# Patient Record
Sex: Male | Born: 1995 | Race: Black or African American | Hispanic: No | Marital: Single | Smoking: Never smoker
Health system: Southern US, Community
[De-identification: ages and names within clinical notes are randomized; demographics above are authoritative.]

## PROBLEM LIST (undated history)

## (undated) DIAGNOSIS — J45909 Unspecified asthma, uncomplicated: Secondary | ICD-10-CM

---
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
## 2016-02-21 ENCOUNTER — Ambulatory Visit (HOSPITAL_COMMUNITY)
Admission: EM | Admit: 2016-02-21 | Discharge: 2016-02-21 | Disposition: A | Discharge status: Home / Self Care | Payer: Medicaid - Out of State | Attending: Family Medicine | Admitting: Family Medicine

## 2016-02-21 ENCOUNTER — Encounter (HOSPITAL_COMMUNITY): Payer: Self-pay | Admitting: Emergency Medicine

## 2016-02-21 DIAGNOSIS — S0083XA Contusion of other part of head, initial encounter: Secondary | ICD-10-CM

## 2016-02-21 NOTE — ED Provider Notes (Signed)
[facility]    CSN: [id number] Arrival date & time: 02/21/16  1052  First Provider Contact:  First MD Initiated Contact with Patient 02/21/16 1212     History   Chief Complaint No chief complaint on file.   HPI Douglas Schmidt is a 20 y.o. male.   HPI He reports a head-to-head collision with his cousin while on a trampoline 2 nights ago. He struck his left upper forehead and noted immediate pain which rapidly improved to where he experienced no pain. He is worried that the area appears indented now. He denies any wound, swelling, tenderness, HA, vision changes, N/V, imbalance, concentration difficulties. No therapies tried.   Past Medical History:  Diagnosis Date  . Asthma, currently inactive    There are no active problems to display for this patient.  No past surgical history on file.  Home Medications    Prior to Admission medications   Medication Sig Start Date End Date Taking? Authorizing Provider  Cetirizine HCl 10 MG CAPS Take 1 capsule (10 mg total) by mouth daily. 01/21/14   Niel Hummer, MD   Family History No family history on file.  Social History Social History  Substance Use Topics  . Smoking status: Never Smoker  . Smokeless tobacco: Not on file  . Alcohol use Not on file   Allergies   Review of patient's allergies indicates no known allergies.  Review of Systems Review of Systems As above.   Physical Exam Triage Vital Signs ED Triage Vitals [02/21/16 1216]  Enc Vitals Group     BP 121/72     Pulse Rate 75     Resp 16     Temp 98.1 F (36.7 C)     Temp Source Oral     SpO2 99 %     Weight      Height      Head Circumference      Peak Flow      Pain Score      Pain Loc      Pain Edu?      Excl. in GC?    No data found.   Updated Vital Signs BP 121/72 (BP Location: Left Arm)   Pulse 75   Temp 98.1 F (36.7 C) (Oral)   Resp 16   SpO2 99%   Physical Exam  Constitutional: He is oriented to person, place, and time. He  appears well-developed and well-nourished. No distress.  HENT:  Nose: Nose normal.  Mouth/Throat: Oropharynx is clear and moist.  normocephalic with very slight protuberance on left forehead without any tenderness or palpable deformity.   Eyes: Conjunctivae and EOM are normal. Pupils are equal, round, and reactive to light. No scleral icterus.  Neck: Normal range of motion. Neck supple.  Cardiovascular: Normal rate, regular rhythm, normal heart sounds and intact distal pulses.   No murmur heard. Pulmonary/Chest: Effort normal and breath sounds normal. No respiratory distress.  Abdominal: Soft. Bowel sounds are normal. He exhibits no distension. There is no tenderness.  Musculoskeletal: Normal range of motion. He exhibits no edema, tenderness or deformity.  Lymphadenopathy:    He has no cervical adenopathy.  Neurological: He is alert and oriented to person, place, and time. No cranial nerve deficit. He exhibits normal muscle tone. Coordination normal.  balance normal with single leg stand  Skin: Skin is warm and dry.  Psychiatric: He has a normal mood and affect. His behavior is normal.  Vitals reviewed.  UC Treatments / Results  Labs (all labs ordered are listed, but only abnormal results are displayed) Labs Reviewed - No data to display  EKG  EKG Interpretation None       Radiology No results found.  Procedures Procedures (including critical care time)  Medications Ordered in UC Medications - No data to display  Initial Impression / Assessment and Plan / UC Course  I have reviewed the triage vital signs and the nursing notes.  Pertinent labs & imaging results that were available during my care of the patient were reviewed by me and considered in my medical decision making (see chart for details).  Final Clinical Impressions(s) / UC Diagnoses   Final diagnoses:  Forehead contusion, initial encounter   No evidence of hematoma. No sequelae consistent with concussion  or fracture. Advised symptomatic management with topical ice and NSAIDs prn.   New Prescriptions Current Discharge Medication List       Tyrone Nine, MD 02/21/16 1243

## 2016-02-21 NOTE — Discharge Instructions (Signed)
You should see improvement in the area gradually over the next few days. Apply cold compresses to the area to help.   If symptoms aren't improved a week after the incident, please seek medical attention.

## 2016-08-02 ENCOUNTER — Encounter (HOSPITAL_COMMUNITY): Payer: Self-pay | Admitting: Family Medicine

## 2016-08-02 ENCOUNTER — Ambulatory Visit (HOSPITAL_COMMUNITY)
Admission: EM | Admit: 2016-08-02 | Discharge: 2016-08-02 | Disposition: A | Discharge status: Home / Self Care | Payer: Self-pay | Attending: Internal Medicine | Admitting: Internal Medicine

## 2016-08-02 DIAGNOSIS — S0090XA Unspecified superficial injury of unspecified part of head, initial encounter: Secondary | ICD-10-CM

## 2016-08-02 DIAGNOSIS — S0990XA Unspecified injury of head, initial encounter: Secondary | ICD-10-CM

## 2016-08-02 NOTE — Discharge Instructions (Signed)
Nice to meet you. I do not feel a dent or appreciate any swelling and do not feel at this time further imaging is warranted. If you are really worried then go to the ED where they can evaluate this further.

## 2016-08-02 NOTE — ED Triage Notes (Signed)
Pt here for head injury. Pt here sts that he has an indention in his for head and he's worried about it.

## 2016-08-02 NOTE — ED Provider Notes (Signed)
CSN: [id number]     Arrival date & time 08/02/16  1751 History   None    Chief Complaint  Patient presents with  . Head Injury   (Consider location/radiation/quality/duration/timing/severity/associated sxs/prior Treatment)  21 yo presents following a "head butt" with a customer at [employer] today. They were both going down to pick up a coin and bumped heads. He is worried about a possible dent on the right side of forehead. He had no LOC. No swelling. No mental changes or dizziness. He has some mild soreness but no pain. No N, V.        Past Medical History:  Diagnosis Date  . Asthma, currently inactive    History reviewed. No pertinent surgical history. History reviewed. No pertinent family history. Social History  Substance Use Topics  . Smoking status: Never Smoker  . Smokeless tobacco: Never Used  . Alcohol use Not on file    Review of Systems  All other systems reviewed and are negative.   Allergies  Patient has no known allergies.  Home Medications   Prior to Admission medications   Medication Sig Start Date End Date Taking? Authorizing Provider  Cetirizine HCl 10 MG CAPS Take 1 capsule (10 mg total) by mouth daily. 01/21/14   Niel Hummeross Kuhner, MD   Meds Ordered and Administered this Visit  Medications - No data to display  BP 136/84   Pulse 62   Temp 97.4 F (36.3 C)   Resp 18   SpO2 96%  No data found.   Physical Exam  Constitutional: He is oriented to person, place, and time. He appears well-developed and well-nourished. No distress.  HENT:  Head: Normocephalic and atraumatic.  His forehead is normal appearing without swelling or signs of trauma. No indention is palpated in the forehead.   Neurological: He is alert and oriented to person, place, and time. No cranial nerve deficit.  Skin: Skin is warm and dry. He is not diaphoretic.  Psychiatric: His behavior is normal.  Nursing note and vitals reviewed.   Urgent Care Course   Clinical Course      Procedures (including critical care time)  Labs Review Labs Reviewed - No data to display  Imaging Review No results found.   Visual Acuity Review  Right Eye Distance:   Left Eye Distance:   Bilateral Distance:    Right Eye Near:   Left Eye Near:    Bilateral Near:         MDM   1. Minor head injury, initial encounter    Reassurance given that I do not appreciate an indention or signs of trauma. I do find a need to urgently send to ED for CT as he is asymptomatic. Certainly if he becomes symptomatic then f/u in the ED for further imaging. Patient is stable for d/C.     Riki SheerMichelle G Young, PA-C 08/02/16 2026

## 2017-02-09 ENCOUNTER — Encounter (HOSPITAL_COMMUNITY): Payer: Self-pay | Admitting: Emergency Medicine

## 2017-02-09 ENCOUNTER — Emergency Department (HOSPITAL_COMMUNITY)
Admission: EM | Admit: 2017-02-09 | Discharge: 2017-02-09 | Disposition: A | Discharge status: Home / Self Care | Payer: Worker's Compensation | Attending: Physician Assistant | Admitting: Physician Assistant

## 2017-02-09 ENCOUNTER — Emergency Department (HOSPITAL_COMMUNITY): Payer: Worker's Compensation

## 2017-02-09 DIAGNOSIS — W208XXA Other cause of strike by thrown, projected or falling object, initial encounter: Secondary | ICD-10-CM | POA: Insufficient documentation

## 2017-02-09 DIAGNOSIS — Y9389 Activity, other specified: Secondary | ICD-10-CM | POA: Diagnosis not present

## 2017-02-09 DIAGNOSIS — Y92512 Supermarket, store or market as the place of occurrence of the external cause: Secondary | ICD-10-CM | POA: Diagnosis not present

## 2017-02-09 DIAGNOSIS — Y990 Civilian activity done for income or pay: Secondary | ICD-10-CM | POA: Diagnosis not present

## 2017-02-09 DIAGNOSIS — J45909 Unspecified asthma, uncomplicated: Secondary | ICD-10-CM | POA: Insufficient documentation

## 2017-02-09 DIAGNOSIS — S0003XA Contusion of scalp, initial encounter: Secondary | ICD-10-CM

## 2017-02-09 DIAGNOSIS — Z79899 Other long term (current) drug therapy: Secondary | ICD-10-CM | POA: Insufficient documentation

## 2017-02-09 DIAGNOSIS — S0990XA Unspecified injury of head, initial encounter: Secondary | ICD-10-CM | POA: Diagnosis present

## 2017-02-09 NOTE — ED Notes (Signed)
Patient taken to XRAY

## 2017-02-09 NOTE — ED Provider Notes (Signed)
[facility]-EMERGENCY DEPT Provider Note   CSN: [id number] Arrival date & time: 02/09/17  1255   By signing my name below, I, Soijett Blue, attest that this documentation has been prepared under the direction and in the presence of Sharen Heck, PA-C Electronically Signed: Soijett Blue, ED Scribe. 02/09/17. 3:31 PM.  History   Chief Complaint Chief Complaint  Patient presents with  . Head Injury  . Headache    HPI Douglas Schmidt is a 21 y.o. male who presents to the Emergency Department complaining of head injury occurring PTA. Pt reports associated 3/10 focal pain to top of his skull. Pt has not tried any medications for the relief of his symptoms. He notes that a metal 20 lb shelf from a cart fell off and struck the top of his head while at work [employer]). Pt states that he saw a "flash" in his eyes that quickly resolved following the incident.  He denies nausea, vomiting, vision change, neck pain, LOC, and any other symptoms. No bleeding.    The history is provided by the patient. No language interpreter was used.    Past Medical History:  Diagnosis Date  . Asthma, currently inactive     There are no active problems to display for this patient.   History reviewed. No pertinent surgical history.     Home Medications    Prior to Admission medications   Medication Sig Start Date End Date Taking? Authorizing Provider  Cetirizine HCl 10 MG CAPS Take 1 capsule (10 mg total) by mouth daily. 01/21/14   Niel Hummer, MD    Family History No family history on file.  Social History Social History  Substance Use Topics  . Smoking status: Never Smoker  . Smokeless tobacco: Never Used  . Alcohol use No     Allergies   Patient has no known allergies.   Review of Systems Review of Systems  Eyes: Negative for visual disturbance.  Gastrointestinal: Negative for nausea and vomiting.  Musculoskeletal: Negative for neck pain.  Skin:       +swelling and pain to top of skull    Neurological: Negative for syncope and headaches.     Physical Exam Updated Vital Signs BP 137/85 (BP Location: Right Arm)   Pulse (!) 58   Temp 98.1 F (36.7 C)   Resp 16   Ht 6' (1.829 m)   Wt 160 lb (72.6 kg)   SpO2 100%   BMI 21.70 kg/m   Physical Exam  Constitutional: He is oriented to person, place, and time. He appears well-developed and well-nourished. No distress.  NAD.  HENT:  Head: Normocephalic.  Right Ear: External ear normal.  Left Ear: External ear normal.  Nose: Nose normal.  Very mild tenderness and edema to top of skull with no crepitus or compression. No facial bone tenderness. No scalp lacerations or abrasions, bony step off of the skull, periorbital or retroauricular ecchymosis.  Eyes: Conjunctivae are normal. No scleral icterus.  PERRL and EOMs intact bilaterally   Neck: Normal range of motion. Neck supple.  Cardiovascular: Normal rate, regular rhythm, normal heart sounds and intact distal pulses.  Exam reveals no gallop and no friction rub.   No murmur heard. Pulmonary/Chest: Effort normal and breath sounds normal. No respiratory distress. He has no wheezes. He has no rales.  Musculoskeletal: Normal range of motion. He exhibits no deformity.  No cervical spine or paraspinal cervical muscular tenderness. Full AROM of neck without pain.   Neurological: He is alert and oriented  to person, place, and time.  CN I not tested. CN II - CN XII intact bilaterally  Skin: Skin is warm and dry. Capillary refill takes less than 2 seconds.  Psychiatric: He has a normal mood and affect. His behavior is normal. Judgment and thought content normal.  Nursing note and vitals reviewed.    ED Treatments / Results  DIAGNOSTIC STUDIES: Oxygen Saturation is 100% on RA, nl by my interpretation.    COORDINATION OF CARE: 3:33 PM Discussed treatment plan with pt at bedside and pt agreed to plan.   Labs (all labs ordered are listed, but only abnormal results are  displayed) Labs Reviewed - No data to display  EKG  EKG Interpretation None       Radiology Dg Skull Complete  Result Date: 02/09/2017 CLINICAL DATA:  Patient reports possible concussion. Patient requested skull films. EXAM: SKULL - COMPLETE 4 + VIEW COMPARISON:  None. FINDINGS: There is no evidence of skull fracture or other focal bone lesions. IMPRESSION: Negative. Electronically Signed   By: Elsie StainJohn T Curnes M.D.   On: 02/09/2017 16:45    Procedures Procedures (including critical care time)  Medications Ordered in ED Medications - No data to display   Initial Impression / Assessment and Plan / ED Course  I have reviewed the triage vital signs and the nursing notes.  Pertinent labs & imaging results that were available during my care of the patient were reviewed by me and considered in my medical decision making (see chart for details).    21 year old male presents to the ED for evaluation of scalp tenderness and edema after a 20 pound metal object landed on top of his head while at work today. No LOC, visual disturbances, nausea, vomiting, neck pain. Exam without obvious signs of skull fracture. No depressed mental status, focal cranial nerve deficits, scalp lacerations or abrasions, bony step-offs of the skull or periorbital/retroauricular ecchymosis. I have low suspicion of skull fracture. Discussed plan to discharge with conservative treatment including NSAIDs, ice. However, patient requested x-rays of his skull to make sure he does not have a skull fracture. I again tried to reassure the patient and advised him that x-ray was most likely not needed today however he again requested x-ray. We will put an order for x-ray per patient request.  Final Clinical Impressions(s) / ED Diagnoses   Final diagnoses:  Contusion of scalp, initial encounter  Work place accident   X-ray negative. Patient considered safe for dishcarge.  New Prescriptions New Prescriptions   No medications on  file   I personally performed the services described in this documentation, which was scribed in my presence. The recorded information has been reviewed and is accurate.    Liberty HandyGibbons, Ren Aspinall J, PA-C 02/09/17 1656    Abelino DerrickMackuen, Courteney Lyn, MD 02/09/17 2029

## 2017-02-09 NOTE — Discharge Instructions (Signed)
You were evaluated in the emergency department for a scalp injury during work. You requested an x-ray of your scalp and skull, this was negative. I suspect you have a small bruise or contusion to your scalp which will resolve on its own. You may use ice topically as needed to decrease inflammation. Ibuprofen or Tylenol will help with pain.

## 2017-02-09 NOTE — ED Notes (Signed)
Patient is A&Ox4 at this time.  Patient in no signs of distress.  Please see providers note for complete history and physical exam.  

## 2017-02-09 NOTE — ED Triage Notes (Signed)
Pt. Stated, there was a self that dropped on my head. No LOC. I felt like I want to lay down. Pt. Was working at [employer].

## 2019-03-18 IMAGING — DX DG SKULL COMPLETE 4+V
6 series · 7 of 7 positions shown · non-contrast
Comparison: None.

CLINICAL DATA: Patient reports possible concussion. Patient
requested skull films.

EXAM:
SKULL - COMPLETE 4 + VIEW

[skull calldwell (1 of 2)]
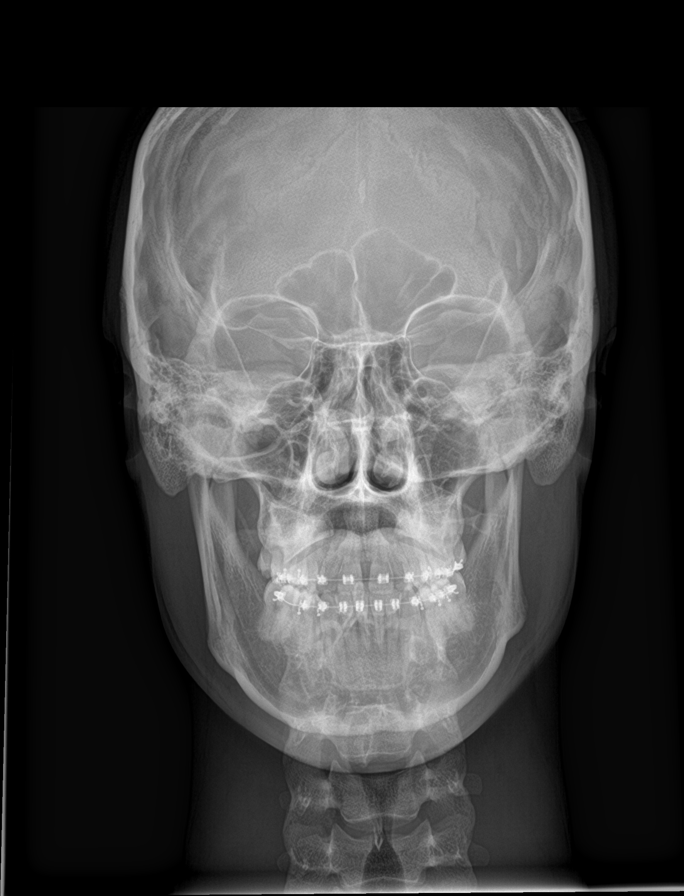

[Series 3: skull towns · 0.14mm/px · 2 of 2 slices shown]
[im 1/2]
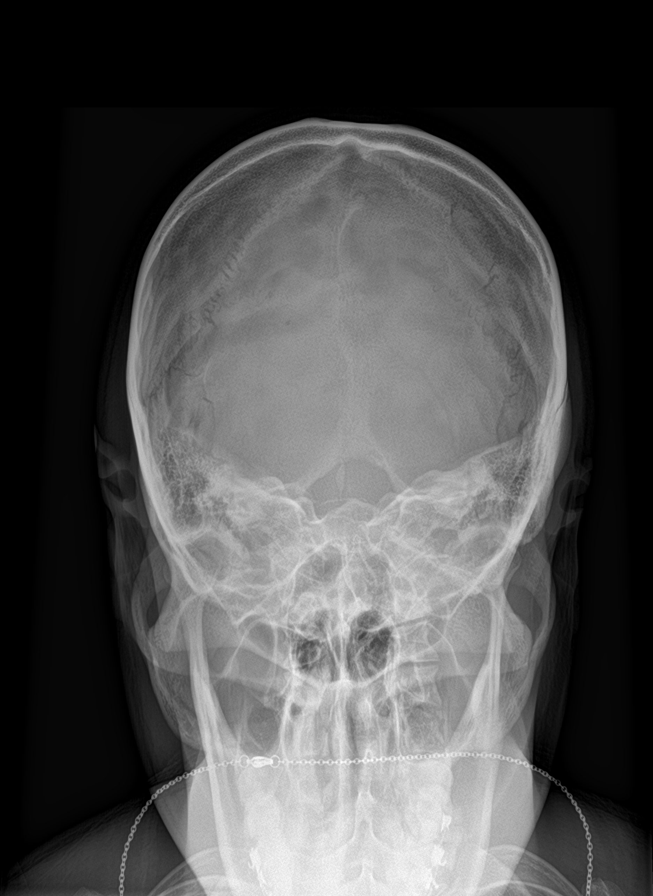
[im 2/2]
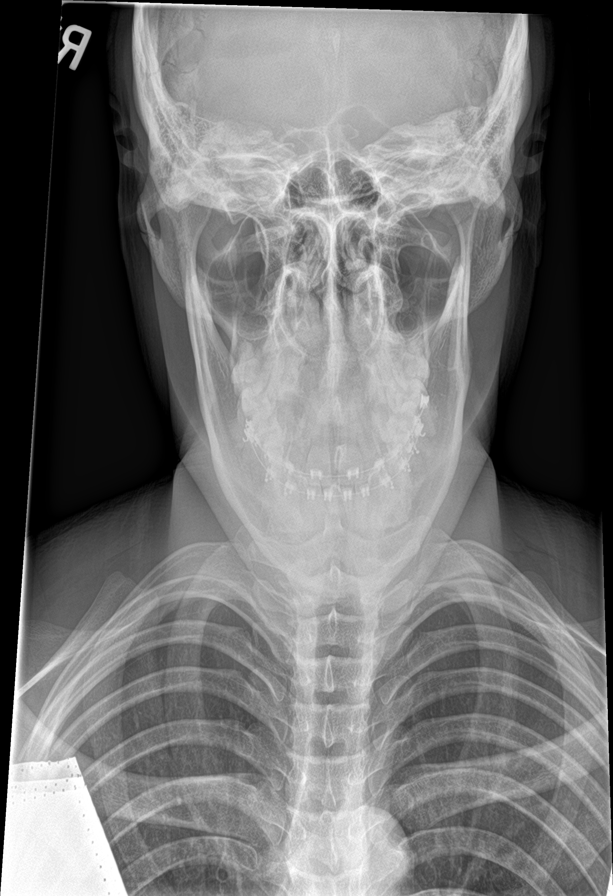

[skull lat (1 of 2)]
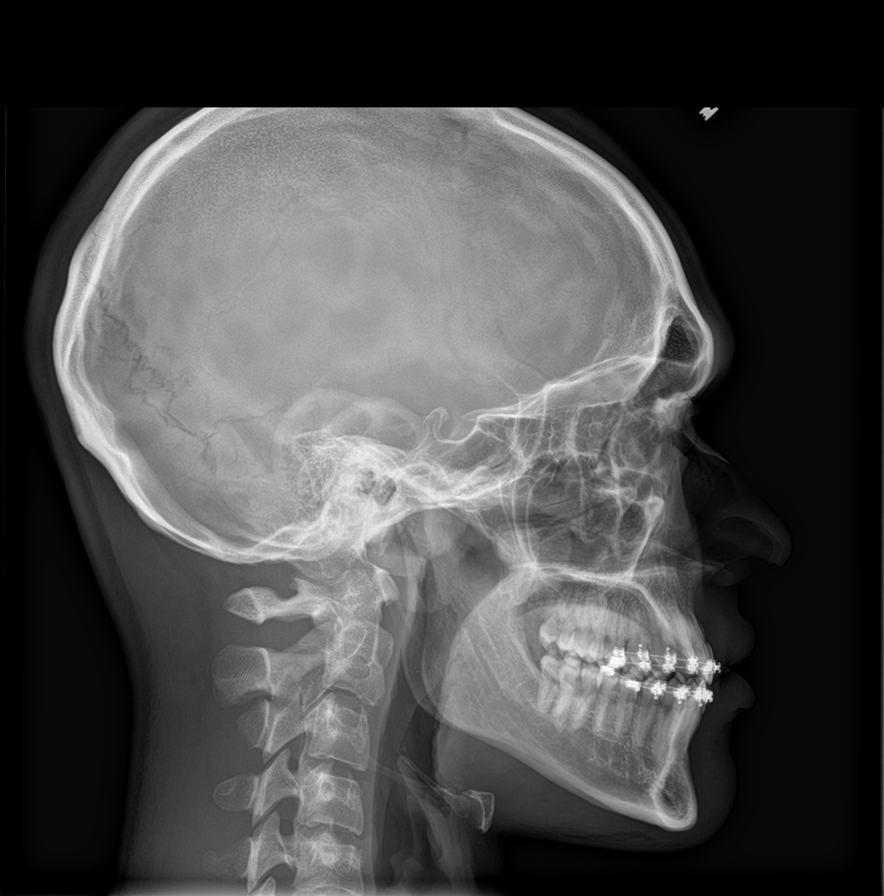

[skull lat (2 of 2)]
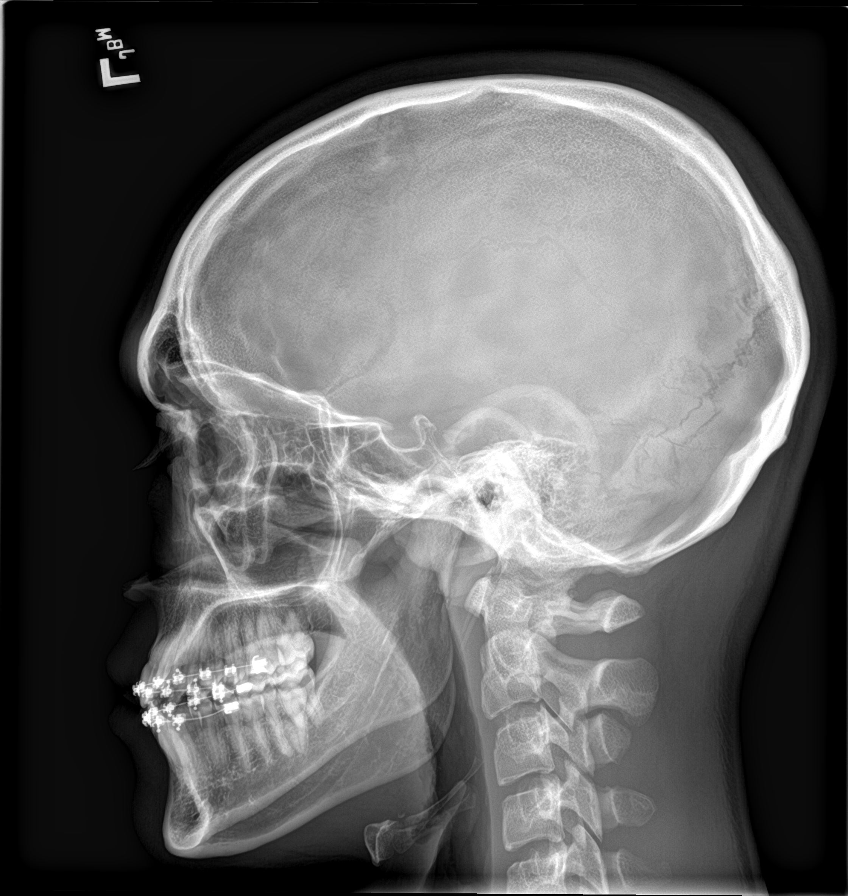

[smv]
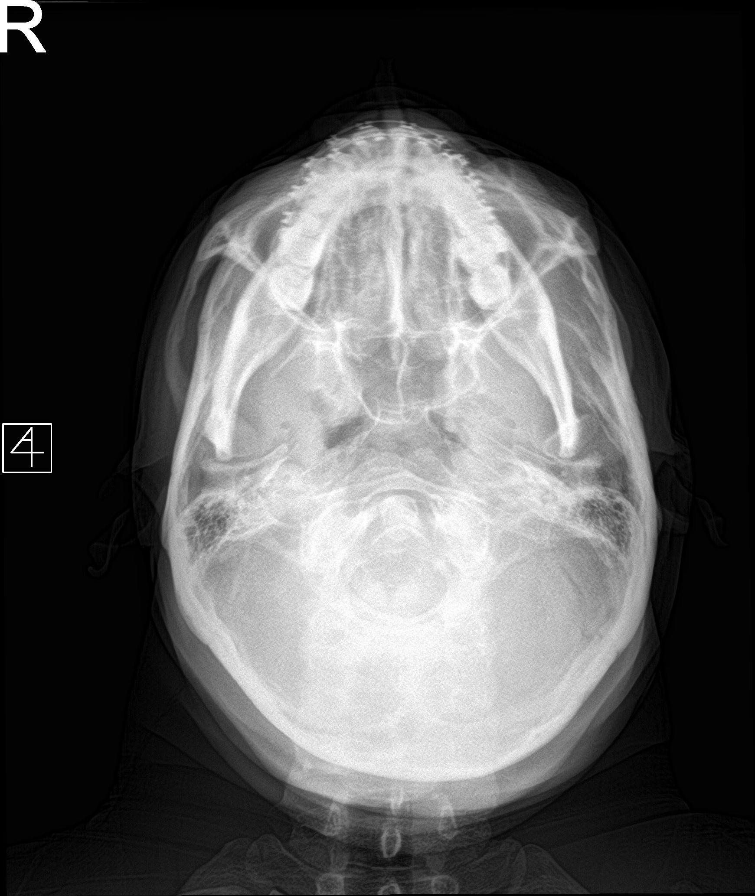

[skull calldwell (2 of 2)]
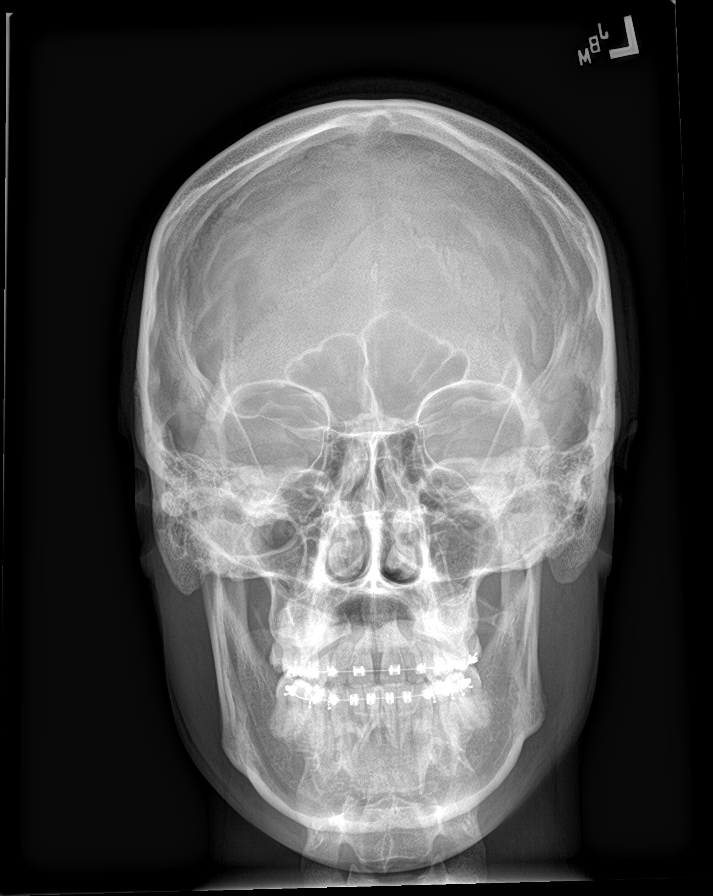

[7 of 7 positions shown; findings below may reference images not displayed]

FINDINGS: There is no evidence of skull fracture or other focal bone lesions.
IMPRESSION: Negative.

## 2020-07-22 ENCOUNTER — Ambulatory Visit (HOSPITAL_COMMUNITY)
Admission: EM | Admit: 2020-07-22 | Discharge: 2020-07-22 | Disposition: A | Discharge status: Home / Self Care | Payer: Medicaid - Out of State

## 2020-07-22 ENCOUNTER — Other Ambulatory Visit: Payer: Self-pay

## 2020-07-23 ENCOUNTER — Encounter (HOSPITAL_COMMUNITY): Payer: Self-pay | Admitting: Emergency Medicine

## 2020-07-23 ENCOUNTER — Emergency Department (HOSPITAL_COMMUNITY)
Admission: EM | Admit: 2020-07-23 | Discharge: 2020-07-23 | Disposition: A | Discharge status: Home / Self Care | Payer: Medicaid - Out of State | Attending: Emergency Medicine | Admitting: Emergency Medicine

## 2020-07-23 DIAGNOSIS — Z113 Encounter for screening for infections with a predominantly sexual mode of transmission: Secondary | ICD-10-CM | POA: Diagnosis present

## 2020-07-23 DIAGNOSIS — Z711 Person with feared health complaint in whom no diagnosis is made: Secondary | ICD-10-CM

## 2020-07-23 DIAGNOSIS — J45909 Unspecified asthma, uncomplicated: Secondary | ICD-10-CM | POA: Diagnosis not present

## 2020-07-23 DIAGNOSIS — Z7251 High risk heterosexual behavior: Secondary | ICD-10-CM

## 2020-07-23 LAB — COMPREHENSIVE METABOLIC PANEL
ALT: 37 U/L (ref 0–44)
AST: 29 U/L (ref 15–41)
Albumin: 4.4 g/dL (ref 3.5–5.0)
Alkaline Phosphatase: 59 U/L (ref 38–126)
Anion gap: 12 (ref 5–15)
BUN: 14 mg/dL (ref 6–20)
CO2: 22 mmol/L (ref 22–32)
Calcium: 9.2 mg/dL (ref 8.9–10.3)
Chloride: 103 mmol/L (ref 98–111)
Creatinine, Ser: 1.06 mg/dL (ref 0.61–1.24)
GFR, Estimated: >60 mL/min (ref >60)
Glucose, Bld: 89 mg/dL (ref 70–99)
Potassium: 3.9 mmol/L (ref 3.5–5.1)
Sodium: 137 mmol/L (ref 135–145)
Total Bilirubin: 0.9 mg/dL (ref 0.3–1.2)
Total Protein: 7.3 g/dL (ref 6.5–8.1)

## 2020-07-23 LAB — RAPID HIV SCREEN (HIV 1/2 AB+AG)
HIV 1/2 Antibodies: NONREACTIVE
HIV-1 P24 Antigen - HIV24: NONREACTIVE

## 2020-07-23 LAB — CBC
HCT: 46.6 % (ref 39.0–52.0)
Hemoglobin: 15 g/dL (ref 13.0–17.0)
MCH: 29.6 pg (ref 26.0–34.0)
MCHC: 32.2 g/dL (ref 30.0–36.0)
MCV: 92.1 fL (ref 80.0–100.0)
Platelets: 198 10*3/uL (ref 150–400)
RBC: 5.06 MIL/uL (ref 4.22–5.81)
RDW: 12.3 % (ref 11.5–15.5)
WBC: 3.3 10*3/uL — ABNORMAL LOW (ref 4.0–10.5)
nRBC: 0 % (ref 0.0–0.2)

## 2020-07-23 LAB — HEPATITIS B SURFACE ANTIGEN: Hepatitis B Surface Ag: NONREACTIVE

## 2020-07-23 LAB — HEPATITIS C ANTIBODY: HCV Ab: NONREACTIVE

## 2020-07-23 MED ORDER — ELVITEG-COBIC-EMTRICIT-TENOFAF 150-150-200-10 MG PREPACK: 1.0000 | ORAL_TABLET | Freq: Once | ORAL | Status: DC

## 2020-07-23 MED ORDER — ELVITEG-COBIC-EMTRICIT-TENOFAF 150-150-200-10 MG PO TABS: 1.0000 | ORAL_TABLET | Freq: Every day | ORAL | 0 refills | Status: DC

## 2020-07-23 NOTE — ED Triage Notes (Signed)
Pt reports concerns of exposure to HIV 2 nights ago, went to [facility] and was sent here for PREP as they do not carry it there.

## 2020-07-23 NOTE — ED Notes (Signed)
Patient discharge instructions reviewed with the patient. The patient verbalized understanding of instructions. Patient discharged. 

## 2020-07-23 NOTE — ED Provider Notes (Signed)
[facility] EMERGENCY DEPARTMENT Provider Note   CSN: [id number] Arrival date & time: 07/23/20  1306     History Chief Complaint  Patient presents with  . Exposure to STD    Douglas Schmidt is a 25 y.o. male with no pertinent past medical history presents today requesting postexposure prophylaxis for HIV. He reports that 2 nights ago he gave unprotected penetrative rectal sex. He states that he does not know if his partner is HIV positive however he became concerned and wishes for postexposure prophylaxis. He denies any known personal history of HIV. He denies any penile lesions or discharge. No testicular pain. He states he originally went to urgent care who sent him here.  HPI     Past Medical History:  Diagnosis Date  . Asthma, currently inactive     There are no problems to display for this patient.   History reviewed. No pertinent surgical history.     No family history on file.  Social History   Tobacco Use  . Smoking status: Never Smoker  . Smokeless tobacco: Never Used  Substance Use Topics  . Alcohol use: No  . Drug use: No    Home Medications Prior to Admission medications   Medication Sig Start Date End Date Taking? Authorizing Provider  elvitegravir-cobicistat-emtricitabine-tenofovir (GENVOYA) 150-150-200-10 MG TABS tablet Take 1 tablet by mouth daily with breakfast. 07/23/20  Yes Cristina Gong, PA-C  Cetirizine HCl 10 MG CAPS Take 1 capsule (10 mg total) by mouth daily. 01/21/14   Niel Hummer, MD    Allergies    Patient has no known allergies.  Review of Systems   Review of Systems  Constitutional: Negative for chills and fever.  Respiratory: Negative for shortness of breath.   Gastrointestinal: Negative for abdominal pain.  Genitourinary: Negative for frequency, penile discharge, penile pain, scrotal swelling and testicular pain.  Musculoskeletal: Negative for back pain and neck pain.  Skin: Negative for color change and  rash.  Neurological: Negative for headaches.  Psychiatric/Behavioral: Negative for confusion.  All other systems reviewed and are negative.   Physical Exam Updated Vital Signs BP 118/76 (BP Location: Right Arm)   Pulse (!) 57   Temp 98.5 F (36.9 C) (Oral)   Resp 13   SpO2 100%   Physical Exam Vitals and nursing note reviewed.  Constitutional:      General: He is not in acute distress.    Appearance: He is not diaphoretic.  HENT:     Head: Normocephalic and atraumatic.  Eyes:     General: No scleral icterus.       Right eye: No discharge.        Left eye: No discharge.     Conjunctiva/sclera: Conjunctivae normal.  Cardiovascular:     Rate and Rhythm: Normal rate and regular rhythm.  Pulmonary:     Effort: Pulmonary effort is normal. No respiratory distress.     Breath sounds: No stridor.  Abdominal:     General: There is no distension.  Genitourinary:    Comments: Deferred Musculoskeletal:        General: No deformity.     Cervical back: Normal range of motion.  Skin:    General: Skin is warm and dry.  Neurological:     Mental Status: He is alert.     Motor: No abnormal muscle tone.  Psychiatric:        Behavior: Behavior normal.     ED Results / Procedures / Treatments  Labs (all labs ordered are listed, but only abnormal results are displayed) Labs Reviewed  CBC - Abnormal; Notable for the following components:      Result Value   WBC 3.3 (*)    All other components within normal limits  RAPID HIV SCREEN (HIV 1/2 AB+AG)  COMPREHENSIVE METABOLIC PANEL  HEPATITIS C ANTIBODY  HEPATITIS B SURFACE ANTIGEN  RPR  GC/CHLAMYDIA PROBE AMP ([place]) NOT AT [facility]    EKG None  Radiology No results found.  Procedures Procedures (including critical care time)  Medications Ordered in ED Medications  elvitegravir-cobicistat-emtricitabine-tenofovir (GENVOYA) 150-150-200-10 Prepack 1 each (has no administration in time range)    ED Course  I have  reviewed the triage vital signs and the nursing notes.  Pertinent labs & imaging results that were available during my care of the patient were reviewed by me and considered in my medical decision making (see chart for details).  Clinical Course as of 07/23/20 2009  Mon Jul 23, 2020  5027 I was informed that patient left.  I called his number listed in the chart and a person reporting to be his mother answered.  I told them I could not give her any information, did not confirm that patient was here and she said she would have him call.   [EH]    Clinical Course User Index [EH] Ollen Gross   MDM Rules/Calculators/A&P                          Patient is a 25 year old man who presents today for post exposure prophylaxis after a reported high risk sexual encounter.  Patient is within 72 hours.  Basic labs were obtained, CBC test shows mild leukopenia with a white count of 3.3 however otherwise CBC, CMP are unremarkable.  Hepatitis B surface antigen, C antibody is nonreactive.  GC testing is ordered.  HIV test is non reactive.  I ordered genvoya Pre-pack and printed rx for one month with consult to [facility] to assist in outpatient continuing medication.    When I went to discuss patient's results with him I was informed that he had left.  I attempted, through HIPAA compliant means, to call patient at his number listed on the chart as I think it is important for him to get postexposure prophylaxis however a woman reporting to be mother answered and said she would have patient call me however I did not get a call back. I did not state that patient had been seen in the ER, did not state why he was here, just that I wished to speak with him.   Ambulatory referral to infectious diseases ordered.  Patient did not provide adequate notice and I was not notified until after he had left.  Based on this I was unable to discuss with him the risks of leaving without getting postexposure prophylaxis.     He did not have a pharmacy on file for me to send the Genvoya to.    Note: Portions of this report may have been transcribed using voice recognition software. Every effort was made to ensure accuracy; however, inadvertent computerized transcription errors may be present   Final Clinical Impression(s) / ED Diagnoses Final diagnoses:  Concern about STD in male without diagnosis  High risk sexual behavior, unspecified type    Rx / DC Orders ED Discharge Orders         Ordered    Ambulatory referral to Infectious Disease  07/23/20 1702    elvitegravir-cobicistat-emtricitabine-tenofovir (GENVOYA) 150-150-200-10 MG TABS tablet  Daily with breakfast        07/23/20 1824           Cristina Gong, Cordelia Poche 07/23/20 2016    Derwood Kaplan, MD 07/24/20 [phone]

## 2020-07-24 ENCOUNTER — Ambulatory Visit (INDEPENDENT_AMBULATORY_CARE_PROVIDER_SITE_OTHER): Payer: Medicaid - Out of State | Admitting: Pharmacist

## 2020-07-24 ENCOUNTER — Other Ambulatory Visit: Payer: Self-pay

## 2020-07-24 DIAGNOSIS — Z206 Contact with and (suspected) exposure to human immunodeficiency virus [HIV]: Secondary | ICD-10-CM

## 2020-07-24 LAB — RPR: RPR Ser Ql: NONREACTIVE

## 2020-07-24 MED ORDER — BIKTARVY 50-200-25 MG PO TABS: 1.0000 | ORAL_TABLET | Freq: Every day | ORAL | 0 refills | Status: AC

## 2020-07-24 NOTE — Progress Notes (Signed)
HPI: Silvano Garofano is a 25 y.o. male who presents to the [facility] pharmacy clinic for post exposure prophylaxis (PEP).  There are no problems to display for this patient.   Patient's Medications  New Prescriptions   No medications on file  Previous Medications   CETIRIZINE HCL 10 MG CAPS    Take 1 capsule (10 mg total) by mouth daily.   ELVITEGRAVIR-COBICISTAT-EMTRICITABINE-TENOFOVIR (GENVOYA) 150-150-200-10 MG TABS TABLET    Take 1 tablet by mouth daily with breakfast.  Modified Medications   No medications on file  Discontinued Medications   No medications on file    Allergies: No Known Allergies  Past Medical History: Past Medical History:  Diagnosis Date  . Asthma, currently inactive     Social History: Social History   Socioeconomic History  . Marital status: Single    Spouse name: Not on file  . Number of children: Not on file  . Years of education: Not on file  . Highest education level: Not on file  Occupational History  . Not on file  Tobacco Use  . Smoking status: Never Smoker  . Smokeless tobacco: Never Used  Substance and Sexual Activity  . Alcohol use: No  . Drug use: No  . Sexual activity: Not on file  Other Topics Concern  . Not on file  Social History Narrative  . Not on file   Social Determinants of Health   Financial Resource Strain: Not on file  Food Insecurity: Not on file  Transportation Needs: Not on file  Physical Activity: Not on file  Stress: Not on file  Social Connections: Not on file    Labs: No results found for: HIV1RNAQUANT, HIV1RNAVL, CD4TABS  RPR and STI Lab Results  Component Value Date   LABRPR NON REACTIVE 07/23/2020    No flowsheet data found.  Hepatitis B Lab Results  Component Value Date   HEPBSAG NON REACTIVE 07/23/2020   Hepatitis C No results found for: HEPCAB, HCVRNAPCRQN Hepatitis A No results found for: HAV Lipids: No results found for: CHOL, TRIG, HDL, CHOLHDL, VLDL,  LDLCALC  Assessment: Velmer comes into the clinic today as a walk-in asking for help with PEP.  He went to the ED yesterday on 1/3 and was going to be prescribed Genvoya but was discharged before he received the medication. He was told to walk in to our clinic for help.   He had unprotected rectal sex on Sunday night around 2 am (approximately 57 hours ago). He does not know the other partner very well and does not know his HIV status. He states that there was no ripping or blood during the encounter. He did receive labs at the ED and his rapid HIV test was negative. Hepatitis A, B, and C were all negative as well. RPR for syphilis testing was negative and his swabs to check for gonorrhea and chlamydia are in process.  I gave him 28 days of Biktarvy to take for PEP as he is uninsured. I will check a HIV viral load today for a more definitive test. I discussed PrEP with him and he doesn't think he needs it as he isn't very sexually active.  I explained what PrEP is and how this encounter could be avoided in the future if he takes PrEP. He will think about it.  I will have him see Tammy Sours in 3-4 weeks for repeat testing. All questions answered. He took his first Biktarvy pill with me in the room. Advised him to separate his  multivitamin and Biktarvy every day and to never take more than one pill per day.  Medication Samples have been provided to the patient.  Drug name: Biktarvy        Strength 50/200/25 mg       Qty: 4 bottles; 28 days   LOT: [id number]  Exp.Date: 09/09/22  Dosing instructions: Take one tablet by mouth once daily  The patient has been instructed regarding the correct time, dose, and frequency of taking this medication, including desired effects and most common side effects.  Plan: - Biktarvy x 28 days for PEP - HIV RNA today - F/u with Tammy Sours 1/28 at 11:15am  Khizar Fiorella L. Mattia Liford, PharmD, BCIDP, AAHIVP, CPP Clinical Pharmacist Practitioner Infectious Diseases Clinical  Pharmacist [facility] 07/24/2020, 11:55 AM

## 2020-08-01 LAB — HIV-1 RNA QUANT-NO REFLEX-BLD
HIV 1 RNA Quant: <20 Copies/mL
HIV-1 RNA Quant, Log: <1.3 Log cps/mL

## 2020-08-05 ENCOUNTER — Encounter: Payer: Self-pay | Admitting: Pharmacist

## 2020-08-17 ENCOUNTER — Other Ambulatory Visit: Payer: Self-pay

## 2020-08-17 ENCOUNTER — Encounter: Payer: Self-pay | Admitting: Family

## 2020-08-17 ENCOUNTER — Ambulatory Visit (INDEPENDENT_AMBULATORY_CARE_PROVIDER_SITE_OTHER): Payer: Medicaid Other | Admitting: Family

## 2020-08-17 VITALS — BP 121/82 | HR 78 | Temp 97.9°F | Wt 151.0 lb

## 2020-08-17 DIAGNOSIS — Z113 Encounter for screening for infections with a predominantly sexual mode of transmission: Secondary | ICD-10-CM

## 2020-08-17 DIAGNOSIS — Z206 Contact with and (suspected) exposure to human immunodeficiency virus [HIV]: Secondary | ICD-10-CM

## 2020-08-17 NOTE — Assessment & Plan Note (Signed)
Mr. Burgett is a 25 y/o male with potential for HIV exposure with risk factor being MSM and started on non-occupational exposure prophylaxis within 72 hours of exposure with Biktarvy. Partner HIV status is unknown. Initial HIV, Hepatitis B and Hepatitis C testing were negative. Discussed the previous lab work and plan of care including basics of HIV and PrEP. Not currently interested in PrEP. Check lab work today. Will complete current course of Biktarvy and will plan to recheck HIV testing in 3 months.

## 2020-08-17 NOTE — Patient Instructions (Addendum)
Nice to see you.  We will check your lab work today.  Continue to finish the Biktarvy you have left.   Plan for follow up in 3 months or sooner if needed.  Please call Family Services if you decide to seek counseling at [phone]  Have a great day and stay safe!

## 2020-08-17 NOTE — Progress Notes (Signed)
Subjective:    Patient ID: Douglas Schmidt, male    DOB: 05/19/1996, 25 y.o.   MRN: [id number]  Chief Complaint  Patient presents with  . Follow-up    Given condoms     HPI:  Douglas Schmidt is a 25 y.o. male with no significant past medical history presents today for initial office visit following non-occupational post-exposure HIV prophylaxis.  Douglas Schmidt was initially seen at Urgent Care and referred to the ED on 1/3 requesting non-occupational post-exposure prophylaxis following unprotected anal sex. Unaware of partner's HIV status. Risk factor for acquiring HIV is MSM. ED results reviewed with negative HIV, HIV RNA level, Hepatitis B surface antigen, and Hepatitis C antibody. He was started on Biktarvy.  Douglas Schmidt has been taking the [facility] daily as prescribed with no adverse side effects. Concerned that he may have lost a pill as he appears to be a day short. Denies fevers, chills, night sweats, headaches, changes in vision, neck pain/stiffness, nausea, diarrhea, vomiting, lesions or rashes.  Douglas Schmidt is currently uninsured and has several questions about where to go from here. Has not been on PrEP in the past. Declines condoms and is not planning on being sexually active for a while.   No Known Allergies    Outpatient Medications Prior to Visit  Medication Sig Dispense Refill  . bictegravir-emtricitabine-tenofovir AF (BIKTARVY) 50-200-25 MG TABS tablet Take 1 tablet by mouth daily. 28 tablet 0  . Cetirizine HCl 10 MG CAPS Take 1 capsule (10 mg total) by mouth daily. (Patient not taking: Reported on 08/17/2020) 30 capsule 1   No facility-administered medications prior to visit.     Past Medical History:  Diagnosis Date  . Asthma, currently inactive       History reviewed. No pertinent surgical history.    History reviewed. No pertinent family history.    Social History   Socioeconomic History  . Marital status: Single    Spouse name: Not on file  . Number  of children: 0  . Years of education: 72  . Highest education level: Not on file  Occupational History  . Not on file  Tobacco Use  . Smoking status: Never Smoker  . Smokeless tobacco: Never Used  Vaping Use  . Vaping Use: Never used  Substance and Sexual Activity  . Alcohol use: No  . Drug use: No  . Sexual activity: Not on file  Other Topics Concern  . Not on file  Social History Narrative  . Not on file   Social Determinants of Health   Financial Resource Strain: Not on file  Food Insecurity: Not on file  Transportation Needs: Not on file  Physical Activity: Not on file  Stress: Not on file  Social Connections: Not on file  Intimate Partner Violence: Not on file      Review of Systems  Constitutional: Negative for appetite change, chills, fatigue, fever and unexpected weight change.  Eyes: Negative for visual disturbance.  Respiratory: Negative for cough, chest tightness, shortness of breath and wheezing.   Cardiovascular: Negative for chest pain and leg swelling.  Gastrointestinal: Negative for abdominal pain, constipation, diarrhea, nausea and vomiting.  Genitourinary: Negative for dysuria, flank pain, frequency, genital sores, hematuria and urgency.  Skin: Negative for rash.  Allergic/Immunologic: Negative for immunocompromised state.  Neurological: Negative for dizziness and headaches.       Objective:    BP 121/82   Pulse 78   Temp 97.9 F (36.6 C) (Oral)   Wt 151 lb (  68.5 kg)   SpO2 100%   BMI 20.48 kg/m  Nursing note and vital signs reviewed.  Physical Exam Constitutional:      General: He is not in acute distress.    Appearance: He is well-developed.  HENT:     Mouth/Throat:     Mouth: Oropharynx is clear and moist.  Eyes:     Conjunctiva/sclera: Conjunctivae normal.  Cardiovascular:     Rate and Rhythm: Normal rate and regular rhythm.     Pulses: Intact distal pulses.     Heart sounds: Normal heart sounds. No murmur heard. No friction  rub. No gallop.   Pulmonary:     Effort: Pulmonary effort is normal. No respiratory distress.     Breath sounds: Normal breath sounds. No wheezing or rales.  Chest:     Chest wall: No tenderness.  Abdominal:     General: Bowel sounds are normal.     Palpations: Abdomen is soft.     Tenderness: There is no abdominal tenderness.  Musculoskeletal:     Cervical back: Neck supple.  Lymphadenopathy:     Cervical: No cervical adenopathy.  Skin:    General: Skin is warm and dry.     Findings: No rash.  Neurological:     Mental Status: He is alert and oriented to person, place, and time.  Psychiatric:        Mood and Affect: Mood is anxious.        Behavior: Behavior normal.        Thought Content: Thought content normal.        Judgment: Judgment normal.         Assessment & Plan:   Patient Active Problem List   Diagnosis Date Noted  . HIV exposure 08/17/2020     Problem List Items Addressed This Visit      Other   HIV exposure - Primary    Douglas Schmidt is a 25 y/o male with potential for HIV exposure with risk factor being MSM and started on non-occupational exposure prophylaxis within 72 hours of exposure with Biktarvy. Partner HIV status is unknown. Initial HIV, Hepatitis B and Hepatitis C testing were negative. Discussed the previous lab work and plan of care including basics of HIV and PrEP. Not currently interested in PrEP. Check lab work today. Will complete current course of Biktarvy and will plan to recheck HIV testing in 3 months.       Relevant Orders   HIV-1/2 AB - differentiation   COMPLETE METABOLIC PANEL WITH GFR   RPR   Hepatitis B surface antigen   Hepatitis B surface antibody,qualitative   Hepatitis C antibody   HIV antibody (with reflex)    Other Visit Diagnoses    Screening for STDs (sexually transmitted diseases)       Relevant Orders   HIV-1/2 AB - differentiation   COMPLETE METABOLIC PANEL WITH GFR   RPR   Hepatitis B surface antigen    Hepatitis B surface antibody,qualitative   Hepatitis C antibody   HIV antibody (with reflex)      I am having Douglas Schmidt maintain his Cetirizine HCl and Biktarvy.   Follow-up: Return in about 3 months (around 11/15/2020).    Marcos Eke, MSN, FNP-C Nurse Practitioner [facility] Main number: [phone]

## 2020-08-18 LAB — COMPLETE METABOLIC PANEL WITH GFR
Albumin: 4.8 g/dL (ref 3.6–5.1)
GFR, Est African American: 106 mL/min/{1.73_m2} (ref >=60)
Globulin: 2.7 g/dL (calc) (ref 1.9–3.7)
Glucose, Bld: 88 mg/dL (ref 65–99)

## 2020-08-20 LAB — COMPLETE METABOLIC PANEL WITH GFR
AG Ratio: 1.8 (calc) (ref 1.0–2.5)
ALT: 24 U/L (ref 9–46)
AST: 23 U/L (ref 10–40)
Alkaline phosphatase (APISO): 59 U/L (ref 36–130)
BUN: 21 mg/dL (ref 7–25)
CO2: 29 mmol/L (ref 20–32)
Calcium: 9.8 mg/dL (ref 8.6–10.3)
Chloride: 101 mmol/L (ref 98–110)
Creat: 1.13 mg/dL (ref 0.60–1.35)
GFR, Est Non African American: 91 mL/min/{1.73_m2} (ref >=60)
Potassium: 4.6 mmol/L (ref 3.5–5.3)
Sodium: 137 mmol/L (ref 135–146)
Total Bilirubin: 0.4 mg/dL (ref 0.2–1.2)
Total Protein: 7.5 g/dL (ref 6.1–8.1)

## 2020-08-20 LAB — HEPATITIS B SURFACE ANTIBODY,QUALITATIVE: Hep B S Ab: NONREACTIVE

## 2020-08-20 LAB — HIV ANTIBODY (ROUTINE TESTING W REFLEX): HIV 1&2 Ab, 4th Generation: NONREACTIVE

## 2020-08-20 LAB — HEPATITIS C ANTIBODY
Hepatitis C Ab: NONREACTIVE
SIGNAL TO CUT-OFF: 0.01 (ref <1.00)

## 2020-08-20 LAB — HEPATITIS B SURFACE ANTIGEN: Hepatitis B Surface Ag: NONREACTIVE

## 2020-08-20 LAB — RPR: RPR Ser Ql: NONREACTIVE

## 2020-11-15 ENCOUNTER — Ambulatory Visit: Payer: Self-pay | Admitting: Family

## 2022-12-28 ENCOUNTER — Ambulatory Visit (HOSPITAL_COMMUNITY)
Admission: EM | Admit: 2022-12-28 | Discharge: 2022-12-28 | Disposition: A | Discharge status: Home / Self Care | Payer: Medicaid Other | Attending: Emergency Medicine | Admitting: Emergency Medicine

## 2022-12-28 ENCOUNTER — Encounter (HOSPITAL_COMMUNITY): Payer: Self-pay

## 2022-12-28 DIAGNOSIS — N5089 Other specified disorders of the male genital organs: Secondary | ICD-10-CM | POA: Insufficient documentation

## 2022-12-28 LAB — POCT URINALYSIS DIP (MANUAL ENTRY)
Bilirubin, UA: NEGATIVE
Glucose, UA: NEGATIVE mg/dL
Leukocytes, UA: NEGATIVE
Nitrite, UA: NEGATIVE
Protein Ur, POC: NEGATIVE mg/dL
Spec Grav, UA: 1.01 (ref 1.010–1.025)
Urobilinogen, UA: 0.2 E.U./dL
pH, UA: 6.5 (ref 5.0–8.0)

## 2022-12-28 MED ORDER — PREDNISONE 20 MG PO TABS: 40.0000 mg | ORAL_TABLET | Freq: Every day | ORAL | 0 refills | Status: AC

## 2022-12-28 MED ORDER — LIDOCAINE HCL (PF) 1 % IJ SOLN
INTRAMUSCULAR | Status: AC
  Filled 2022-12-28: qty 2

## 2022-12-28 MED ORDER — CEFTRIAXONE SODIUM 500 MG IJ SOLR
500.0000 mg | INTRAMUSCULAR | Status: DC
  Administered 2022-12-28: 500 mg via INTRAMUSCULAR

## 2022-12-28 MED ORDER — CIPROFLOXACIN HCL 500 MG PO TABS: 500.0000 mg | ORAL_TABLET | Freq: Two times a day (BID) | ORAL | 0 refills | Status: AC

## 2022-12-28 MED ORDER — CEFTRIAXONE SODIUM 500 MG IJ SOLR
INTRAMUSCULAR | Status: AC
  Filled 2022-12-28: qty 500

## 2022-12-28 NOTE — ED Triage Notes (Signed)
Patient here today with concerns of pain swelling in his groin area X 4 days. A couple days ago he felt feverish with chills. Lying down on his back helps. He took Tylenol which helped a little.

## 2022-12-28 NOTE — ED Provider Notes (Addendum)
[facility]    CSN: [id number] Arrival date & time: 12/28/22  1259      History   Chief Complaint Chief Complaint  Patient presents with   Groin Swelling    HPI Douglas Schmidt is a 27 y.o. male.   Presents for evaluation of right groin swelling and pain present for 4 days.  Felt feverish 1 day ago.  Pain has been constant fluctuating in intensity, aching primarily but sharp and throbbing pains felt with movement.  Symptoms do seem to improve when lying down.  Has attempted use of Tylenol which has been minimally effective.  Sexually active, no known exposure.  Denies urinary symptoms, penile discharge, nausea, vomiting, diarrhea.  Past Medical History:  Diagnosis Date   Asthma, currently inactive     Patient Active Problem List   Diagnosis Date Noted   HIV exposure 08/17/2020    History reviewed. No pertinent surgical history.     Home Medications    Prior to Admission medications   Medication Sig Start Date End Date Taking? Authorizing Provider  bictegravir-emtricitabine-tenofovir AF (BIKTARVY) 50-200-25 MG TABS tablet Take 1 tablet by mouth daily. 07/24/20   Kuppelweiser, Cassie L, RPH-CPP  Cetirizine HCl 10 MG CAPS Take 1 capsule (10 mg total) by mouth daily. Patient not taking: Reported on 08/17/2020 01/21/14   Niel Hummer, MD    Family History History reviewed. No pertinent family history.  Social History Social History   Tobacco Use   Smoking status: Never   Smokeless tobacco: Never  Vaping Use   Vaping Use: Never used  Substance Use Topics   Alcohol use: No   Drug use: No     Allergies   Patient has no known allergies.   Review of Systems Review of Systems   Physical Exam Triage Vital Signs ED Triage Vitals  Enc Vitals Group     BP 12/28/22 1352 112/74     Pulse Rate 12/28/22 1352 84     Resp 12/28/22 1352 18     Temp 12/28/22 1352 99.7 F (37.6 C)     Temp Source 12/28/22 1352 Oral     SpO2 12/28/22 1352 97 %     Weight  12/28/22 1352 150 lb (68 kg)     Height 12/28/22 1352 5\' 11"  (1.803 m)     Head Circumference --      Peak Flow --      Pain Score 12/28/22 1351 7     Pain Loc --      Pain Edu? --      Excl. in GC? --    No data found.  Updated Vital Signs BP 112/74 (BP Location: Right Arm)   Pulse 84   Temp 99.7 F (37.6 C) (Oral)   Resp 18   Ht 5\' 11"  (1.803 m)   Wt 150 lb (68 kg)   SpO2 97%   BMI 20.92 kg/m   Visual Acuity Right Eye Distance:   Left Eye Distance:   Bilateral Distance:    Right Eye Near:   Left Eye Near:    Bilateral Near:     Physical Exam Constitutional:      Appearance: Normal appearance.  Eyes:     Extraocular Movements: Extraocular movements intact.  Pulmonary:     Effort: Pulmonary effort is normal.  Genitourinary:    Comments: Severe right testicle swelling with erythema, skin hot to touch, tender to palpation, no abnormalities to the penis or left testicle, no rash noted Neurological:  Mental Status: He is alert and oriented to person, place, and time. Mental status is at baseline.      UC Treatments / Results  Labs (all labs ordered are listed, but only abnormal results are displayed) Labs Reviewed - No data to display  EKG   Radiology No results found.  Procedures Procedures (including critical care time)  Medications Ordered in UC Medications - No data to display  Initial Impression / Assessment and Plan / UC Course  I have reviewed the triage vital signs and the nursing notes.  Pertinent labs & imaging results that were available during my care of the patient were reviewed by me and considered in my medical decision making (see chart for details).  Right Testicle swelling  Approximately the size of a grapefruit, signs of torsion, urinalysis negative, STI labs pending, Rocephin injection given nauseous and prescribed ciprofloxacin and prednisone for outpatient use, recommended for worsening symptoms to go to the nearest emergency  department to receive ultrasound and for persisting symptoms to follow-up with urology, walking referral given Final Clinical Impressions(s) / UC Diagnoses   Final diagnoses:  None   Discharge Instructions   None    ED Prescriptions   None    PDMP not reviewed this encounter.   Valinda Hoar, NP 12/28/22 1454    Valinda Hoar, NP 12/28/22 1454

## 2022-12-28 NOTE — Discharge Instructions (Addendum)
On exam your right testicle is significantly swollen and there is redness and heat to the skin and therefore you will be treated for infection today  STI panel is pending, you will be notified of any positive test results and treatment sent in at time of notification, please refrain from having any sexual intercourse during this time  Urinalysis negative for infection  You have been given an injection of ceftriaxone here in the office to help kick start reduction of infection  Begin ciprofloxacin every morning and every evening for 7 days  Begin prednisone every morning with food for 5 days to help reduce inflammation and to provide you some comfort  If your symptoms continue to persist but do not worsen you may follow-up with urology for reevaluation information listed on front page  At any point if your symptoms worsen please go to the nearest emergency department for immediate evaluation and and for ultrasound imaging

## 2022-12-29 LAB — CYTOLOGY, (ORAL, ANAL, URETHRAL) ANCILLARY ONLY
Chlamydia: NEGATIVE
Comment: NEGATIVE
Comment: NEGATIVE
Comment: NORMAL
Neisseria Gonorrhea: NEGATIVE
Trichomonas: NEGATIVE
